# Patient Record
Sex: Female | Born: 1950 | Hispanic: No | Marital: Single
Health system: Southern US, Community
[De-identification: ages and names within clinical notes are randomized; demographics above are authoritative.]

## PROBLEM LIST (undated history)

## (undated) DIAGNOSIS — A8100 Creutzfeldt-Jakob disease, unspecified: Secondary | ICD-10-CM

## (undated) DIAGNOSIS — E119 Type 2 diabetes mellitus without complications: Secondary | ICD-10-CM

## (undated) DIAGNOSIS — F0390 Unspecified dementia without behavioral disturbance: Secondary | ICD-10-CM

## (undated) DIAGNOSIS — I10 Essential (primary) hypertension: Secondary | ICD-10-CM

---

## 2003-05-26 ENCOUNTER — Other Ambulatory Visit: Payer: Self-pay

## 2004-02-11 ENCOUNTER — Other Ambulatory Visit: Payer: Self-pay

## 2005-01-31 ENCOUNTER — Emergency Department: Payer: Self-pay | Admitting: Emergency Medicine

## 2005-02-16 ENCOUNTER — Emergency Department: Payer: Self-pay | Admitting: Emergency Medicine

## 2005-08-21 IMAGING — CR SACRUM AND COCCYX - 2+ VIEW
1 series · 4 of 4 positions shown · non-contrast
Comparison: none

REASON FOR EXAM: fall
COMMENTS:  LMP: Now

[Series 1: view not recorded · 0.17mm/px · 4 of 4 slices shown]
[im 1/4]
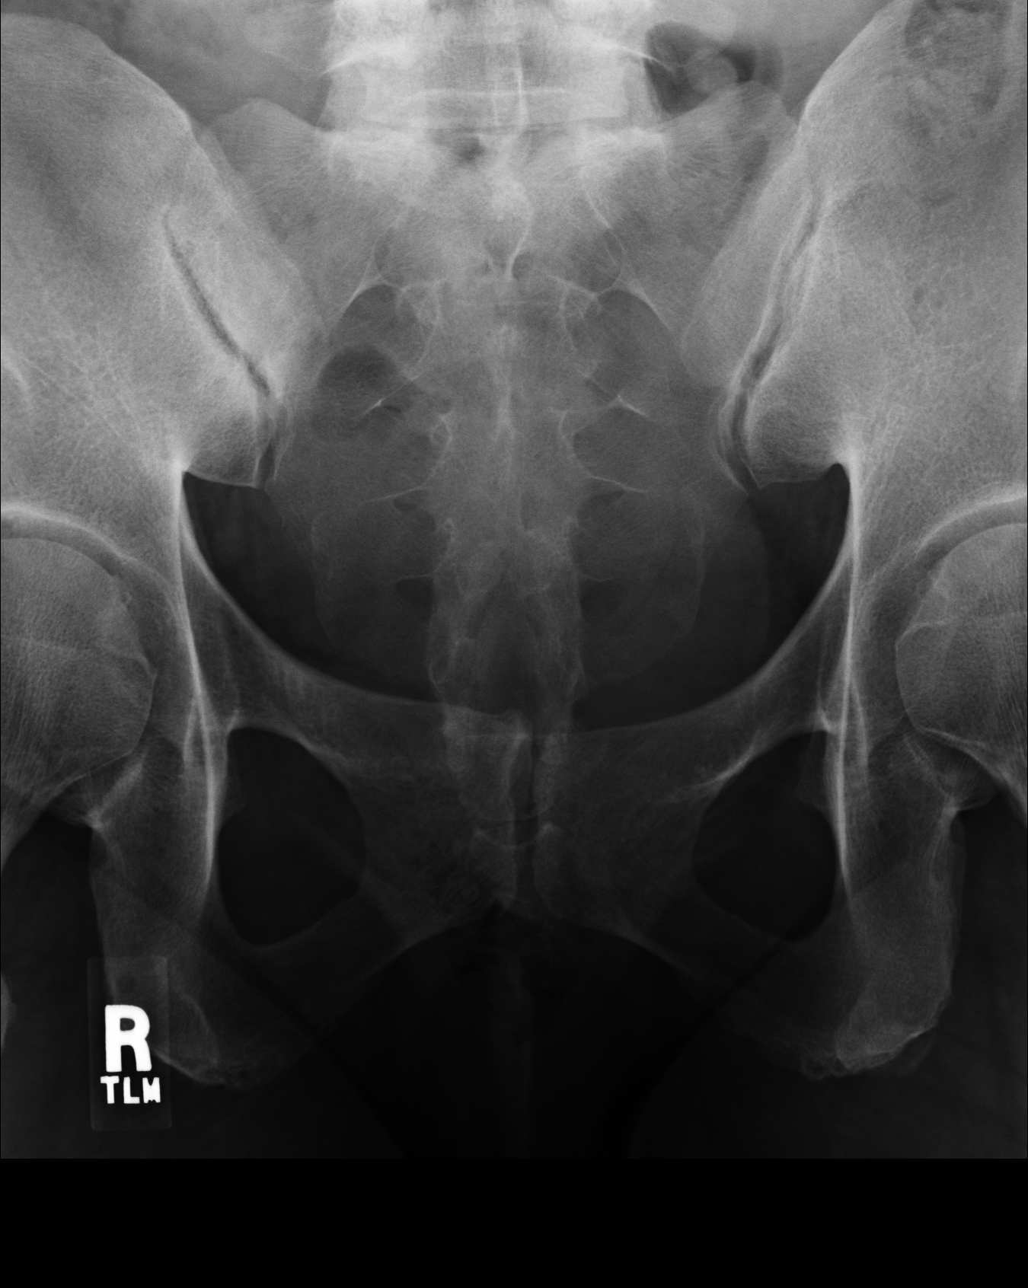
[im 2/4]
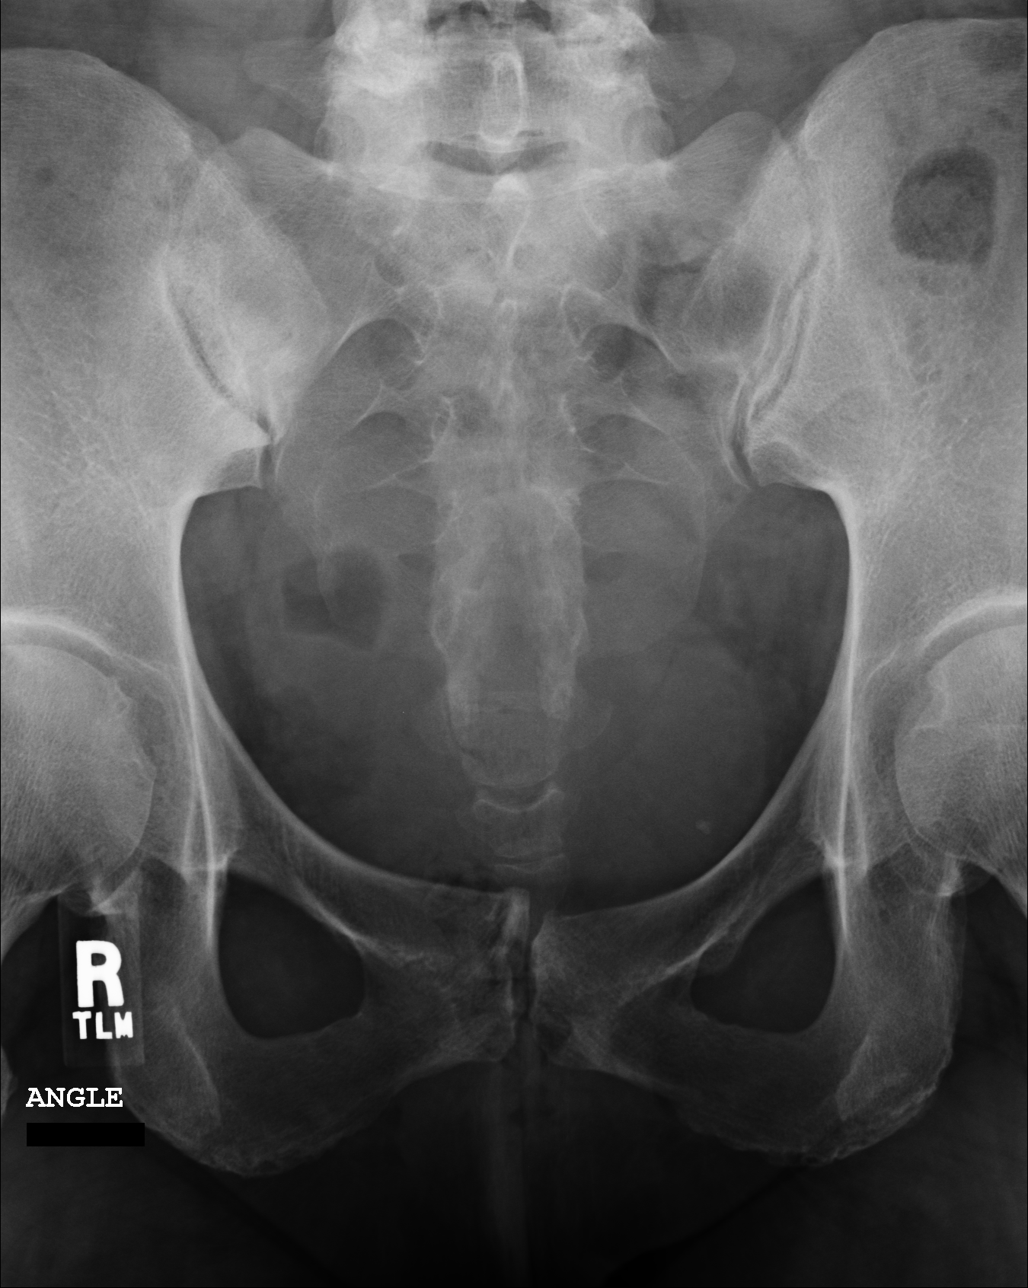
[im 3/4]
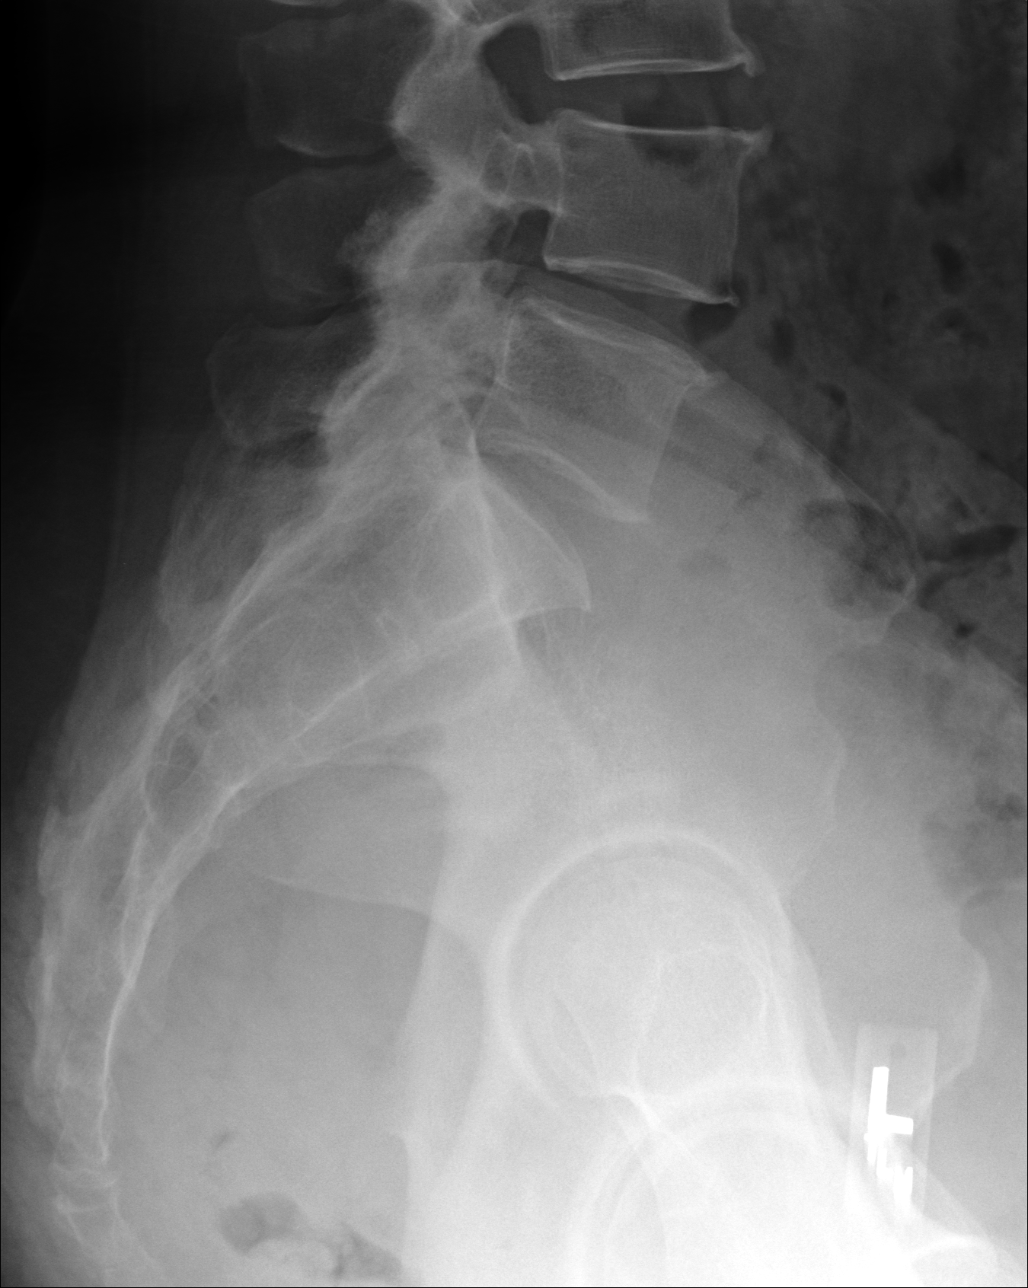
[im 4/4]
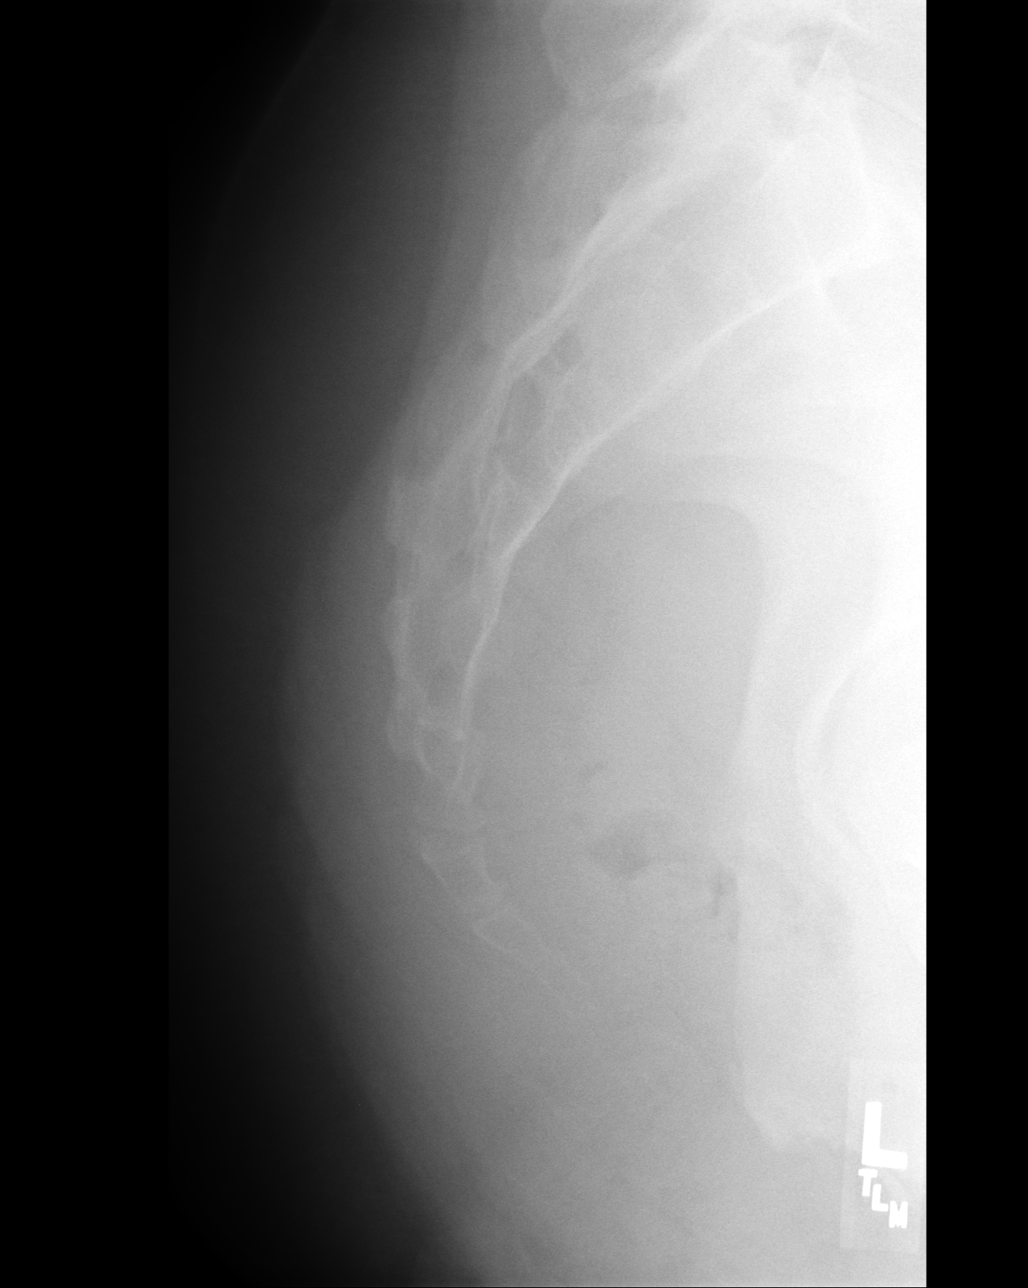

[4 of 4 positions shown; findings below may reference images not displayed]

PROCEDURE:     DXR - DXR SACRUM AND COCCYX  - February 01, 2005 [DATE]

RESULT:       Angled AP views and lateral views were obtained.   The
anterior projections show the sacral arches are intact.   There appear to be
some degenerative changes in the hips and the pubic symphysis.  The lateral
views demonstrate no definite fracture or dislocation.
IMPRESSION: No evidence of fracture or dislocation.

## 2007-09-26 ENCOUNTER — Emergency Department: Payer: Self-pay | Admitting: Emergency Medicine

## 2011-04-10 ENCOUNTER — Emergency Department: Payer: Self-pay | Admitting: Emergency Medicine

## 2012-09-22 ENCOUNTER — Emergency Department: Payer: Self-pay | Admitting: Emergency Medicine

## 2012-09-22 LAB — URINALYSIS, COMPLETE
Bacteria: NONE SEEN
Blood: NEGATIVE
Ketone: NEGATIVE
Ph: 7 (ref 4.5–8.0)
Specific Gravity: 1.011 (ref 1.003–1.030)

## 2012-09-22 LAB — CBC
HCT: 43.6 % (ref 35.0–47.0)
HGB: 14.7 g/dL (ref 12.0–16.0)
MCHC: 33.8 g/dL (ref 32.0–36.0)
MCV: 81 fL (ref 80–100)
WBC: 7 10*3/uL (ref 3.6–11.0)

## 2012-09-22 LAB — COMPREHENSIVE METABOLIC PANEL
Alkaline Phosphatase: 108 U/L (ref 50–136)
Anion Gap: 6 — ABNORMAL LOW (ref 7–16)
Bilirubin,Total: 0.3 mg/dL (ref 0.2–1.0)
Chloride: 105 mmol/L (ref 98–107)
Co2: 28 mmol/L (ref 21–32)
EGFR (African American): >60
Glucose: 137 mg/dL — ABNORMAL HIGH (ref 65–99)
Osmolality: 279 (ref 275–301)
Potassium: 3.8 mmol/L (ref 3.5–5.1)
SGOT(AST): 26 U/L (ref 15–37)
SGPT (ALT): 21 U/L (ref 12–78)

## 2016-04-22 ENCOUNTER — Emergency Department
Admission: EM | Admit: 2016-04-22 | Discharge: 2016-04-23 | Disposition: A | Discharge status: Home / Self Care | Attending: Emergency Medicine | Admitting: Emergency Medicine

## 2016-04-22 ENCOUNTER — Emergency Department

## 2016-04-22 DIAGNOSIS — R531 Weakness: Secondary | ICD-10-CM

## 2016-04-22 DIAGNOSIS — E119 Type 2 diabetes mellitus without complications: Secondary | ICD-10-CM | POA: Insufficient documentation

## 2016-04-22 DIAGNOSIS — L89152 Pressure ulcer of sacral region, stage 2: Secondary | ICD-10-CM | POA: Insufficient documentation

## 2016-04-22 DIAGNOSIS — I10 Essential (primary) hypertension: Secondary | ICD-10-CM | POA: Insufficient documentation

## 2016-04-22 DIAGNOSIS — R262 Difficulty in walking, not elsewhere classified: Secondary | ICD-10-CM

## 2016-04-22 DIAGNOSIS — Z79899 Other long term (current) drug therapy: Secondary | ICD-10-CM | POA: Diagnosis not present

## 2016-04-22 DIAGNOSIS — M25552 Pain in left hip: Secondary | ICD-10-CM | POA: Diagnosis not present

## 2016-04-22 DIAGNOSIS — M25559 Pain in unspecified hip: Secondary | ICD-10-CM

## 2016-04-22 HISTORY — DX: Creutzfeldt-Jakob disease, unspecified: A81.00

## 2016-04-22 HISTORY — DX: Type 2 diabetes mellitus without complications: E11.9

## 2016-04-22 HISTORY — DX: Essential (primary) hypertension: I10

## 2016-04-22 HISTORY — DX: Unspecified dementia, unspecified severity, without behavioral disturbance, psychotic disturbance, mood disturbance, and anxiety: F03.90

## 2016-04-22 LAB — CBC WITH DIFFERENTIAL/PLATELET
BASOS ABS: 0.1 10*3/uL (ref 0–0.1)
BASOS PCT: 1 %
EOS ABS: 0 10*3/uL (ref 0–0.7)
Eosinophils Relative: 0 %
HEMATOCRIT: 40.5 % (ref 35.0–47.0)
HEMOGLOBIN: 13.2 g/dL (ref 12.0–16.0)
Lymphocytes Relative: 13 %
Lymphs Abs: 1.2 10*3/uL (ref 1.0–3.6)
MCH: 26.7 pg (ref 26.0–34.0)
MCHC: 32.6 g/dL (ref 32.0–36.0)
MCV: 81.7 fL (ref 80.0–100.0)
MONO ABS: 0.9 10*3/uL (ref 0.2–0.9)
Monocytes Relative: 9 %
NEUTROS ABS: 7.6 10*3/uL — AB (ref 1.4–6.5)
NEUTROS PCT: 77 %
Platelets: 305 10*3/uL (ref 150–440)
RBC: 4.96 MIL/uL (ref 3.80–5.20)
RDW: 13.8 % (ref 11.5–14.5)
WBC: 9.8 10*3/uL (ref 3.6–11.0)

## 2016-04-22 LAB — COMPREHENSIVE METABOLIC PANEL
ALBUMIN: 2.7 g/dL — AB (ref 3.5–5.0)
ALT: 40 U/L (ref 14–54)
AST: 63 U/L — AB (ref 15–41)
Alkaline Phosphatase: 76 U/L (ref 38–126)
Anion gap: 6 (ref 5–15)
BILIRUBIN TOTAL: 0.7 mg/dL (ref 0.3–1.2)
BUN: 11 mg/dL (ref 6–20)
CO2: 25 mmol/L (ref 22–32)
Calcium: 9.4 mg/dL (ref 8.9–10.3)
Chloride: 106 mmol/L (ref 101–111)
Creatinine, Ser: 0.76 mg/dL (ref 0.44–1.00)
GFR calc Af Amer: >60 mL/min (ref >60)
GFR calc non Af Amer: >60 mL/min (ref >60)
GLUCOSE: 260 mg/dL — AB (ref 65–99)
POTASSIUM: 4 mmol/L (ref 3.5–5.1)
SODIUM: 137 mmol/L (ref 135–145)
TOTAL PROTEIN: 6.9 g/dL (ref 6.5–8.1)

## 2016-04-22 LAB — TROPONIN I: Troponin I: <0.03 ng/mL (ref <0.03)

## 2016-04-22 LAB — URINALYSIS COMPLETE WITH MICROSCOPIC (ARMC ONLY)
Bilirubin Urine: NEGATIVE
Glucose, UA: NEGATIVE mg/dL
KETONES UR: NEGATIVE mg/dL
NITRITE: NEGATIVE
PH: 6 (ref 5.0–8.0)
PROTEIN: 30 mg/dL — AB
SPECIFIC GRAVITY, URINE: 1.014 (ref 1.005–1.030)

## 2016-04-22 LAB — GLUCOSE, CAPILLARY
GLUCOSE-CAPILLARY: 204 mg/dL — AB (ref 65–99)
Glucose-Capillary: 154 mg/dL — ABNORMAL HIGH (ref 65–99)
Glucose-Capillary: 162 mg/dL — ABNORMAL HIGH (ref 65–99)

## 2016-04-22 MED ORDER — LATANOPROST 0.005 % OP SOLN
1.0000 [drp] | Freq: Every day | OPHTHALMIC | Status: DC
  Filled 2016-04-22: qty 2.5

## 2016-04-22 MED ORDER — LORAZEPAM 2 MG/ML IJ SOLN
0.5000 mg | Freq: Once | INTRAMUSCULAR | Status: AC
  Administered 2016-04-22: 0.5 mg via INTRAVENOUS
  Filled 2016-04-22: qty 1

## 2016-04-22 MED ORDER — POLYETHYLENE GLYCOL 3350 17 G PO PACK
17.0000 g | PACK | Freq: Every day | ORAL | Status: DC
  Administered 2016-04-23: 17 g via ORAL
  Filled 2016-04-22: qty 1

## 2016-04-22 MED ORDER — QUETIAPINE FUMARATE 25 MG PO TABS
100.0000 mg | ORAL_TABLET | Freq: Four times a day (QID) | ORAL | Status: DC
  Administered 2016-04-22 – 2016-04-23 (×4): 100 mg via ORAL
  Filled 2016-04-22 (×4): qty 4

## 2016-04-22 MED ORDER — LEVOTHYROXINE SODIUM 50 MCG PO TABS
50.0000 ug | ORAL_TABLET | Freq: Every day | ORAL | Status: DC
  Administered 2016-04-23: 50 ug via ORAL
  Filled 2016-04-22 (×2): qty 1

## 2016-04-22 MED ORDER — INSULIN DETEMIR 100 UNIT/ML ~~LOC~~ SOLN
40.0000 [IU] | Freq: Every day | SUBCUTANEOUS | Status: DC
  Filled 2016-04-22: qty 0.4

## 2016-04-22 MED ORDER — TRAZODONE HCL 50 MG PO TABS
50.0000 mg | ORAL_TABLET | Freq: Every day | ORAL | Status: DC
  Administered 2016-04-22: 50 mg via ORAL
  Filled 2016-04-22 (×2): qty 1

## 2016-04-22 MED ORDER — INSULIN ASPART 100 UNIT/ML ~~LOC~~ SOLN
SUBCUTANEOUS | Status: AC
  Administered 2016-04-22: 7 [IU] via SUBCUTANEOUS
  Filled 2016-04-22: qty 7

## 2016-04-22 MED ORDER — PANTOPRAZOLE SODIUM 40 MG PO TBEC
40.0000 mg | DELAYED_RELEASE_TABLET | Freq: Every day | ORAL | Status: DC
  Administered 2016-04-23: 40 mg via ORAL
  Filled 2016-04-22 (×2): qty 1

## 2016-04-22 MED ORDER — LORAZEPAM 1 MG PO TABS: 1.0000 mg | ORAL_TABLET | ORAL | Status: DC | PRN

## 2016-04-22 MED ORDER — QUETIAPINE FUMARATE 25 MG PO TABS
25.0000 mg | ORAL_TABLET | Freq: Every day | ORAL | Status: DC
  Administered 2016-04-22: 25 mg via ORAL
  Filled 2016-04-22: qty 1

## 2016-04-22 MED ORDER — HALOPERIDOL 0.5 MG PO TABS
0.5000 mg | ORAL_TABLET | ORAL | Status: DC | PRN
  Filled 2016-04-22: qty 2

## 2016-04-22 MED ORDER — ATORVASTATIN CALCIUM 20 MG PO TABS
80.0000 mg | ORAL_TABLET | Freq: Every day | ORAL | Status: DC
  Administered 2016-04-23: 80 mg via ORAL
  Filled 2016-04-22 (×2): qty 4

## 2016-04-22 MED ORDER — POLYETHYLENE GLYCOL 3350 17 GM/SCOOP PO POWD: 17.0000 g | Freq: Every day | ORAL | Status: DC

## 2016-04-22 MED ORDER — LORAZEPAM 0.5 MG PO TABS
0.5000 mg | ORAL_TABLET | Freq: Once | ORAL | Status: DC
  Filled 2016-04-22: qty 1

## 2016-04-22 MED ORDER — INSULIN ASPART 100 UNIT/ML ~~LOC~~ SOLN
7.0000 [IU] | Freq: Three times a day (TID) | SUBCUTANEOUS | Status: DC
  Administered 2016-04-22: 3 [IU] via SUBCUTANEOUS
  Administered 2016-04-22 – 2016-04-23 (×2): 7 [IU] via SUBCUTANEOUS
  Filled 2016-04-22 (×2): qty 7

## 2016-04-22 MED ORDER — ENALAPRIL MALEATE 10 MG PO TABS
20.0000 mg | ORAL_TABLET | Freq: Two times a day (BID) | ORAL | Status: DC
  Administered 2016-04-22 (×2): 20 mg via ORAL
  Filled 2016-04-22 (×3): qty 2
  Filled 2016-04-22: qty 1
  Filled 2016-04-22 (×2): qty 2

## 2016-04-22 MED ORDER — TIMOLOL MALEATE 0.5 % OP SOLN
1.0000 [drp] | Freq: Every day | OPHTHALMIC | Status: DC
  Administered 2016-04-22: 1 [drp] via OPHTHALMIC
  Filled 2016-04-22: qty 5

## 2016-04-22 MED ORDER — INSULIN DETEMIR 100 UNIT/ML ~~LOC~~ SOLN
70.0000 [IU] | Freq: Every day | SUBCUTANEOUS | Status: DC
  Filled 2016-04-22: qty 0.7

## 2016-04-22 MED ORDER — SENNOSIDES-DOCUSATE SODIUM 8.6-50 MG PO TABS
2.0000 | ORAL_TABLET | Freq: Every day | ORAL | Status: DC
  Administered 2016-04-22: 2 via ORAL
  Filled 2016-04-22: qty 2
  Filled 2016-04-22: qty 1

## 2016-04-22 NOTE — Progress Notes (Signed)
Inpatient Diabetes Program Recommendations  AACE/ADA: New Consensus Statement on Inpatient Glycemic Control (2015)  Target Ranges:  Prepandial:   less than 140 mg/dL      Peak postprandial:   less than 180 mg/dL (1-2 hours)      Critically ill patients:  140 - 180 mg/dL   Lab Results  Component Value Date   GLUCAP 204 (H) 04/22/2016    Review of Glycemic Control:  Results for Angela Saunders, Angela Saunders (MRN [id number]) as of 04/22/2016 11:11  Ref. Range 04/22/2016 09:13  Glucose-Capillary Latest Ref Range: 65 - 99 mg/dL 956204 (H)   Diabetes history: Type 2 diabetes Outpatient Diabetes medications: Levemir 70 units q HS, Novolog 7 units tid with meals Current orders for Inpatient glycemic control:  Levemir 70 units q HS, Novolog 7 units tid with meals  Inpatient Diabetes Program Recommendations:   Please consider adding Novolog moderate tid with meals and HS.    Thanks, Beryl MeagerJenny Lizandro Spellman, RN, BC-ADM Inpatient Diabetes Coordinator Pager [phone] (8a-5p)

## 2016-04-22 NOTE — Evaluation (Signed)
Physical Therapy Evaluation Patient Details Name: Angela Saunders MRN: [id number] DOB: [birth year] Today's Date: 04/22/2016   History of Present Illness  Pt is a 65 y/o F who presented to the ED with chief complaint of L hip pain and generalized weakness after a fall out of her WC at home.  CT L hip negative for acute fracture.  Pt's PMH includes Creutzfeldt-Jakob disease, dementia.       Clinical Impression  Pt admitted with above diagnosis. Pt currently with functional limitations due to the deficits listed below (see PT Problem List). Pt easily agitated and pt appears to have a very strong pain response even to light touch.  Pt screaming "Mom!" and thrashing out at PT and RN with any physical touch and does not stop screaming until both have left pt's room.  Spoke with pt's daughter on the phone who reported pt's baseline as bed>WC transfer with assist and pt able to propel WC using LEs.  Per daughter pt is constantly agitated and this is not a new behavior.  Explained this PT's recommendation for SNF at d/c and passed the phone to CSW, Angela FifeLynn, to discuss d/c planning.  Daughter, Angela SeminoleRosalyn, very appreciative.  Pt will benefit from skilled PT to increase their independence and safety with mobility to allow discharge to the venue listed below.      Follow Up Recommendations SNF    Equipment Recommendations  Other (comment) (TBD at next venue of care)    Recommendations for Other Services       Precautions / Restrictions Precautions Precautions: Fall;Other (comment) Precaution Comments: very agitated and aggressive  Restrictions Weight Bearing Restrictions: No      Mobility  Bed Mobility Overal bed mobility: Needs Assistance;+2 for physical assistance Bed Mobility: Rolling Rolling: Total assist;+2 for physical assistance         General bed mobility comments: Total +2 assist to perform rolling for positioning rotation as pt becomes aggressive reaching for and grabbing RN and this  PT.  Pillow placed under L hip for pt comfort.  Transfers                 General transfer comment: unable to assess  Ambulation/Gait                Stairs            Wheelchair Mobility    Modified Rankin (Stroke Patients Only)       Balance                                             Pertinent Vitals/Pain Pain Assessment: Faces Faces Pain Scale: Hurts whole lot Pain Location: generalized (becomes aggressive with any physical touch) Pain Descriptors / Indicators: Grimacing;Guarding (yelling) Pain Intervention(s): Limited activity within patient's tolerance;Monitored during session;Repositioned    Home Living Family/patient expects to be discharged to:: Skilled nursing facility                      Prior Function Level of Independence: Needs assistance   Gait / Transfers Assistance Needed: Per daughter pt needed assist [facility] and then pt could manage her WC around the home using her LEs to propel  ADL's / Homemaking Assistance Needed: Needs assist for all ADLs  Comments: Pt is from home with daughter as 24/7 caregiver.  She  was receiving HH hopice services PTA but daughter feels as though pt is not receiving adequate care with this.     Hand Dominance        Extremity/Trunk Assessment               Lower Extremity Assessment: RLE deficits/detail;LLE deficits/detail RLE Deficits / Details: No tone noted with PROM heel slide and DF to neutral. Otherwise unable to perform formal assessment due to pt's agitation and aggressive behavior LLE Deficits / Details: No tone noted with PROM heel slide and DF to neutral. Otherwise unable to perform formal assessment due to pt's agitation and aggressive behavior     Communication   Communication: No difficulties  Cognition Arousal/Alertness: Awake/alert Behavior During Therapy: Agitated;Anxious Overall Cognitive Status: History of cognitive  impairments - at baseline                      General Comments General comments (skin integrity, edema, etc.): Pt easily agitated and pt appears to have a very strong pain response even to light touch.  Pt screaming "Mom!" and thrashing out at PT and RN with any physical touch and does not stop screaming until both have left pt's room.  Spoke with pt's daughter on the phone who reported pt's baseline as bed>WC transfer with assist and pt able to propel WC using LEs.  Per daughter pt is constantly agitated and this is not a new behavior.  Explained this PT's recommendation for SNF at d/c and passed the phone to CSW, Angela Saunders, to discuss d/c planning.  Daughter, Angela Saunders, very appreciative.     Exercises     Assessment/Plan    PT Assessment Patient needs continued PT services  PT Problem List Decreased strength;Decreased activity tolerance;Decreased balance;Decreased mobility;Decreased cognition;Decreased knowledge of use of DME;Decreased safety awareness;Pain          PT Treatment Interventions DME instruction;Functional mobility training;Therapeutic activities;Therapeutic exercise;Balance training;Neuromuscular re-education;Cognitive remediation;Patient/family education;Wheelchair mobility training    PT Goals (Current goals can be found in the Care Plan section)  Acute Rehab PT Goals Patient Stated Goal: Per daughter, SNF at d/c PT Goal Formulation: With family Time For Goal Achievement: 04/29/16 Potential to Achieve Goals: Fair    Frequency Min 2X/week   Barriers to discharge   Pt currently unable to tolerate any level of mobility    Co-evaluation               End of Session   Activity Tolerance: Treatment limited secondary to agitation Patient left: in bed Nurse Communication: Mobility status    Functional Assessment Tool Used: Clinical Judgement Functional Limitation: Mobility: Walking and moving around Mobility: Walking and Moving Around Current Status  (X9147): At least 80 percent but less than 100 percent impaired, limited or restricted Mobility: Walking and Moving Around Goal Status [phone]): At least 80 percent but less than 100 percent impaired, limited or restricted    Time: 2130-8657 PT Time Calculation (min) (ACUTE ONLY): 12 min   Charges:   PT Evaluation $PT Eval Moderate Complexity: 1 Procedure     PT G Codes:   PT G-Codes **NOT FOR INPATIENT CLASS** Functional Assessment Tool Used: Clinical Judgement Functional Limitation: Mobility: Walking and moving around Mobility: Walking and Moving Around Current Status (Q4696): At least 80 percent but less than 100 percent impaired, limited or restricted Mobility: Walking and Moving Around Goal Status [phone]): At least 80 percent but less than 100 percent impaired, limited or restricted   Encarnacion Chu PT,  DPT 04/22/2016, 2:18 PM

## 2016-04-22 NOTE — Progress Notes (Signed)
ED visit made. Patient is currently followed by [facility] at home with a  Diagnosis of Creutzfeldt-Jakob disease. She is a Full Code. Patient was sent to the ED overnight by her daughter Geri SeminoleRosalyn for evaluation of left leg pain and sacral wound. CT and performed on her left hip was negative for any acute fracture. Per chart note review and discussion with patient's hospice RN case manager her daughter is open to possible placement. Physical therapy evaluation has been ordered. Writer spoke with ED CSW Donnelly StagerLynn Bryant who is awaiting PT outcome before discussing [facility] as PT evaluation will determine type of placement. Patient seen lying on the ED stretcher, alert, appeared drowsy and did state "I'm sleepy". No family present at time of visit, her daughter had gone home to get some rest.  Per staff RN Katie patient does call out with any physical interaction such as turning, she was able to drink both apple juice and milk as well as eat peanutbutter crackers when fed. She does have her home medications ordered. Hospital care team made aware that she is a current [facility] patient. Writer's contact information left with staff RN Dillard'sShannin. Will continue to follow and update hospice team. Thank you. Dayna BarkerKaren Robertson RN, BSN, [facility],  [facility] Liaison [phone] c

## 2016-04-22 NOTE — Progress Notes (Signed)
CSW received consult for possible placement. Pt has not yet been seen by PT. CSW will be available to assist in potential placement for pt once PT assesses the appropriate level of care. CSW will continue to follow pt and assist as needed.  Jonathon JordanLynn B Rubye Strohmeyer, MSW, Theresia MajorsLCSWA [phone]

## 2016-04-22 NOTE — ED Notes (Signed)
Daughter Angela Saunders called to give this nurse update on her discussion with the MD. Daughter said she was " ok" with the urinalysis (in and out cath) if this was necessary for placement. Previously this shift, daughter said she did not want the in and out catheter, therefore we were unable to collect urinalysis earlier.

## 2016-04-22 NOTE — ED Provider Notes (Signed)
[facility] Emergency Department Provider Note   ____________________________________________   First MD Initiated Contact with Patient 04/22/16 0118     (approximate)  I have reviewed the triage vital signs and the nursing notes.   HISTORY  Chief Complaint Leg Pain  History limited by dementia; history obtained by daughter  HPI Angela Saunders is a 65 y.o. female brought to the ED from home via EMS with a chief complaint of left hip pain, decreased oral appetite and generalized weakness. 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 Daughter reports patient pivoted and fell one week ago and complains of persistent left hip pain. Daughter is more concerned that patient has had decreased PO intake, generalized weakness, and sacral decubitus that she feels is being inadequately cared for by hospice nurses. Denies recent fever, chest pain, shortness of breath, abdominal pain, vomiting, diarrhea.Nothing makes patient's symptoms better or worse.   Past Medical History:  Diagnosis Date  . Creutzfeldt-Jakob disease   . Dementia   . Diabetes mellitus without complication (HCC)   . Hypertension     There are no active problems to display for this patient.   No past surgical history on file.  Prior to Admission medications   Medication Sig Start Date End Date Taking? Authorizing Provider  atorvastatin (LIPITOR) 80 MG tablet Take 1 tablet by mouth daily. 01/29/16  Yes Historical Provider, MD  [employer] SENNA PLUS 8.6-50 MG tablet Take 2 tablets by mouth at bedtime. 04/10/16  Yes Historical Provider, MD  enalapril (VASOTEC) 20 MG tablet Take 1 tablet by mouth 2 (two) times daily. 01/19/16  Yes Historical Provider, MD  haloperidol (HALDOL) 0.5 MG tablet Take 1-2 tablets by mouth every 4 (four) hours as needed. 04/07/16  Yes Historical Provider, MD  latanoprost (XALATAN) 0.005 % ophthalmic  solution Place 1 drop into both eyes at bedtime. 01/29/16  Yes Historical Provider, MD  LEVEMIR FLEXTOUCH 100 UNIT/ML Pen Inject 70 Units into the skin at bedtime. 03/11/16  Yes Historical Provider, MD  levothyroxine (SYNTHROID, LEVOTHROID) 50 MCG tablet Take 1 tablet by mouth daily. 02/03/16  Yes Historical Provider, MD  LORazepam (ATIVAN) 1 MG tablet Take 1 tablet by mouth every 2 (two) hours as needed. 04/07/16  Yes Historical Provider, MD  NOVOLOG FLEXPEN 100 UNIT/ML FlexPen Inject 7 Units into the skin 3 (three) times daily. 03/08/16  Yes Historical Provider, MD  pantoprazole (PROTONIX) 40 MG tablet Take 40 mg by mouth daily. 02/03/16  Yes Historical Provider, MD  polyethylene glycol powder (GLYCOLAX/MIRALAX) powder Take 17 g by mouth daily. 01/29/16  Yes Historical Provider, MD  QUEtiapine (SEROQUEL) 100 MG tablet Take 100 mg by mouth every 6 (six) hours. 04/07/16  Yes Historical Provider, MD  QUEtiapine (SEROQUEL) 25 MG tablet Take 25 mg by mouth at bedtime. 04/09/16  Yes Historical Provider, MD  timolol (TIMOPTIC) 0.5 % ophthalmic solution Place 1 drop into both eyes daily. 01/29/16  Yes Historical Provider, MD  traZODone (DESYREL) 50 MG tablet Take 1 tablet by mouth at bedtime. 02/15/16  Yes Historical Provider, MD    Allergies Metformin and Oxycodone  No family history on file.  Social History Social History  Substance Use Topics  . Smoking status: Not on file  . Smokeless tobacco: Not on file  . Alcohol use Not on file    Review of Systems  Constitutional: Positive for generalized weakness and decreased oral intake. No fever/chills.  Eyes: No visual changes. ENT: No sore throat. Cardiovascular: Denies chest pain. Respiratory: Denies shortness of breath. Gastrointestinal: No abdominal pain.  No nausea, no vomiting.  No diarrhea.  No constipation. Genitourinary: Negative for dysuria. Musculoskeletal: Positive for left hip pain. Negative for back pain. Skin: Negative for  rash. Neurological: Negative for headaches, focal weakness or numbness.  10-point ROS otherwise negative.  ____________________________________________   PHYSICAL EXAM:  VITAL SIGNS: ED Triage Vitals  Enc Vitals Group     BP 04/22/16 0121 116/73     Pulse Rate 04/22/16 0118 (!) 102     Resp 04/22/16 0118 16     Temp 04/22/16 0118 98.3 F (36.8 C)     Temp Source 04/22/16 0118 Oral     SpO2 04/22/16 0118 98 %     Weight 04/22/16 0118 250 lb (113.4 kg)     Height --      Head Circumference --      Peak Flow --      Pain Score --      Pain Loc --      Pain Edu? --      Excl. in GC? --     Constitutional: Alert and oriented. Chronically ill appearing and in mild acute distress. Cries on examination of left hip and transferring from EMS stretcher to ED bed. Eyes: Conjunctivae are normal. PERRL. EOMI. Head: Atraumatic. Nose: No congestion/rhinnorhea. Mouth/Throat: Mucous membranes are moist.  Oropharynx non-erythematous. Neck: No stridor.  No cervical spine tenderness to palpation.  No step-offs or deformities. Cardiovascular: Normal rate, regular rhythm. Grossly normal heart sounds.  Good peripheral circulation. Respiratory: Normal respiratory effort.  No retractions. Lungs CTAB. Gastrointestinal: Soft and nontender. No distention. No abdominal bruits. No CVA tenderness. Stage II sacral decubitus. Musculoskeletal: Left lower leg presents. Tender to palpation left hip. No obvious deformities noted. Exam limited by patient uncooperative. Neurologic:  Nonverbal at baseline.  Skin:  Skin is warm, dry and intact. No rash noted. Psychiatric: Mood and affect are normal. Speech and behavior are normal.  ____________________________________________   LABS (all labs ordered are listed, but only abnormal results are displayed)  Labs Reviewed  CBC WITH DIFFERENTIAL/PLATELET - Abnormal; Notable for the following:       Result Value   Neutro Abs 7.6 (*)    All other components  within normal limits  COMPREHENSIVE METABOLIC PANEL - Abnormal; Notable for the following:    Glucose, Bld 260 (*)    Albumin 2.7 (*)    AST 63 (*)    All other components within normal limits  TROPONIN I  URINALYSIS COMPLETEWITH MICROSCOPIC ([facility] ONLY)   ____________________________________________  EKG  ED ECG REPORT I, Korina Tretter J, the attending physician, personally viewed and interpreted this ECG.   Date: 04/22/2016  EKG Time: 0137  Rate: 99  Rhythm: normal EKG, normal sinus rhythm  Axis: Normal  Intervals:none  ST&T Change: Nonspecific  ____________________________________________  RADIOLOGY  CT left hip without contrast interpreted per Dr. Clovis Riley: Negative for acute fracture. Mild soft tissue edema or hemorrhage  without drainable collection, deep to the medial aspect of the left  gluteus maximus.   ____________________________________________   PROCEDURES  Procedure(s) performed: None  Procedures  Critical Care performed: No  ____________________________________________   INITIAL IMPRESSION / ASSESSMENT AND PLAN / ED COURSE  Pertinent labs & imaging results that were available during my care of the patient were reviewed by me and considered in my medical decision making (see chart for details).  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 Also with generalized weakness and decreased oral intake. Will obtain lab work, urinalysis, x-ray imaging studies and reassess.  Clinical Course  Value Comment By Time  DG Hip Unilat With Pelvis 2-3 Views Left (Reviewed) Irean HongJade J Londa Mackowski, MD 10/30 0219   Extensive discussion with daughter. Updated her of x-ray imaging results. Hospice nurse called; there is a respite bed available for patients. However, daughter does not want patient to go due to prior bad experience. She is interested in placement. I explained to her  that patient would not be an operative candidate given that she is normally bedbound. However, will obtain CT to evaluate for fracture. Daughter does not want an in out catheter; will attempt to collect urine. At this time, laboratory results do not warrant an inpatient hospitalization. Will consult clinical social work for placement. Irean HongJade J Rashika Bettes, MD 10/30 [phone]   Updated daughter of negative CT results. Will proceed with plan to for patient in the emergency department pending clinical social work evaluation for placement. Irean HongJade J Yasha Tibbett, MD 10/30 0510   Patient sleeping in no acute distress. Once she is awake, we will switch her to a more comfortable, inpatient bed as she awaits social work consult. Diet order will be placed by nursing. Irean HongJade J Niyah Mamaril, MD 10/30 [phone]     ____________________________________________   FINAL CLINICAL IMPRESSION(S) / ED DIAGNOSES  Final diagnoses:  Weakness  Pain of left hip joint  Sacral decubitus ulcer, stage II      NEW MEDICATIONS STARTED DURING THIS VISIT:  New Prescriptions   No medications on file     Note:  This document was prepared using Dragon voice recognition software and may include unintentional dictation errors.    Irean HongJade J Tiaira Arambula, MD 04/22/16 [phone]

## 2016-04-22 NOTE — ED Triage Notes (Signed)
Per EMS, pt's daughter was transferring pt 1 week ago to wheelchair and pt fell.  Pt on home hospice, and home hospice stated they didn't feel like it was broken.  Daughter reported to EMS that pain has gotten worse over past week.  Pt has dementia at baseline and not able to provide information to this RN at this time.  Per EMS, daughter on way.

## 2016-04-22 NOTE — ED Notes (Signed)
Pt repositioned in bed, new VS taken. CBG checked, pt given apple juice. MD notified of new diet order and daily medications ordered. Pt resting comfortably on stretcher, will continue to monitor.

## 2016-04-22 NOTE — [place] FL2 (Signed)
[place] MEDICAID FL2 LEVEL OF CARE SCREENING TOOL     IDENTIFICATION  Patient Name: Angela Saunders Birthdate: Jul 10, 1950 Sex: female Admission Date (Current Location): 04/22/2016  [place]:  [place] [id number] R Facility and Address:  [facility], [address], [place], [place] [address]      Provider Number: [id number]  Attending Physician Name and Address:  Daryel NovemberJonathan Williams, MD  Relative Name and Phone Number:       Current Level of Care: Hospital Recommended Level of Care: Skilled Nursing Facility Prior Approval Number:    Date Approved/Denied: 04/22/16 PASRR Number: [id number]  Discharge Plan: SNF    Current Diagnoses: 1. Weakness   2. Pain of left hip joint   3. Sacral decubitus ulcer, stage II   4. Difficulty in walking     Active Ambulatory Problems    Diagnosis Date Noted  . No Active Ambulatory Problems   Resolved Ambulatory Problems    Diagnosis Date Noted  . No Resolved Ambulatory Problems   Past Medical History:  Diagnosis Date  . Creutzfeldt-Jakob disease   . Dementia   . Diabetes mellitus without complication (HCC)   . Hypertension      Orientation RESPIRATION BLADDER Height & Weight     Self  Normal Incontinent Weight: 250 lb (113.4 kg) Height:     BEHAVIORAL SYMPTOMS/MOOD NEUROLOGICAL BOWEL NUTRITION STATUS      Incontinent Diet (Diet Carb Modified Fluid consistency: Thin)  AMBULATORY STATUS COMMUNICATION OF NEEDS Skin   Extensive Assist Verbally Normal                       Personal Care Assistance Level of Assistance  Bathing, Feeding, Dressing Bathing Assistance: Limited assistance Feeding assistance: Independent Dressing Assistance: Limited assistance     Functional Limitations Info  Sight, Hearing, Speech Sight Info: Adequate Hearing Info: Adequate Speech Info: Adequate    SPECIAL CARE FACTORS FREQUENCY  PT (By licensed PT), OT (By licensed OT)     PT  Frequency: 5x OT Frequency: 5x            Contractures Contractures Info: Not present    Additional Factors Info  Insulin Sliding Scale, Code Status, Allergies Code Status Info: Full Code Allergies Info:  Metformin, Oxycodone   Insulin Sliding Scale Info: insulin aspart (novoLOG) injection 7 Units       Current Medications (04/22/2016):  This is the current hospital active medication list Current Facility-Administered Medications  Medication Dose Route Frequency Provider Last Rate Last Dose  . atorvastatin (LIPITOR) tablet 80 mg  80 mg Oral Daily Emily FilbertJonathan E Williams, MD      . enalapril (VASOTEC) tablet 20 mg  20 mg Oral BID Emily FilbertJonathan E Williams, MD   20 mg at 04/22/16 1132  . haloperidol (HALDOL) tablet 0.5-1 mg  0.5-1 mg Oral Q4H PRN Emily FilbertJonathan E Williams, MD      . insulin aspart (novoLOG) injection 7 Units  7 Units Subcutaneous TID Emily FilbertJonathan E Williams, MD   7 Units at 04/22/16 1048  . insulin detemir (LEVEMIR) injection 70 Units  70 Units Subcutaneous QHS Emily FilbertJonathan E Williams, MD      . latanoprost (XALATAN) 0.005 % ophthalmic solution 1 drop  1 drop Both Eyes QHS Emily FilbertJonathan E Williams, MD      . levothyroxine (SYNTHROID, LEVOTHROID) tablet 50 mcg  50 mcg Oral Daily Emily FilbertJonathan E Williams, MD      . LORazepam (ATIVAN) tablet 1 mg  1 mg  Oral Q2H PRN Emily FilbertJonathan E Williams, MD      . pantoprazole (PROTONIX) EC tablet 40 mg  40 mg Oral Daily Emily FilbertJonathan E Williams, MD      . polyethylene glycol (MIRALAX / GLYCOLAX) packet 17 g  17 g Oral Daily Emily FilbertJonathan E Williams, MD      . QUEtiapine (SEROQUEL) tablet 100 mg  100 mg Oral Q6H Emily FilbertJonathan E Williams, MD   100 mg at 04/22/16 1139  . QUEtiapine (SEROQUEL) tablet 25 mg  25 mg Oral QHS Emily FilbertJonathan E Williams, MD      . senna-docusate (Senokot-S) tablet 2 tablet  2 tablet Oral QHS Emily FilbertJonathan E Williams, MD      . timolol (TIMOPTIC) 0.5 % ophthalmic solution 1 drop  1 drop Both Eyes Daily Emily FilbertJonathan E Williams, MD   1 drop at 04/22/16 1132  . traZODone  (DESYREL) tablet 50 mg  50 mg Oral QHS Emily FilbertJonathan E Williams, MD       Current Outpatient Prescriptions  Medication Sig Dispense Refill  . atorvastatin (LIPITOR) 80 MG tablet Take 1 tablet by mouth daily.    . [facility] SENNA PLUS 8.6-50 MG tablet Take 2 tablets by mouth at bedtime.  12  . enalapril (VASOTEC) 20 MG tablet Take 1 tablet by mouth 2 (two) times daily.    . haloperidol (HALDOL) 0.5 MG tablet Take 1-2 tablets by mouth every 4 (four) hours as needed.  1  . latanoprost (XALATAN) 0.005 % ophthalmic solution Place 1 drop into both eyes at bedtime.    Marland Kitchen. LEVEMIR FLEXTOUCH 100 UNIT/ML Pen Inject 70 Units into the skin at bedtime.  5  . levothyroxine (SYNTHROID, LEVOTHROID) 50 MCG tablet Take 1 tablet by mouth daily.    Marland Kitchen. LORazepam (ATIVAN) 1 MG tablet Take 1 tablet by mouth every 2 (two) hours as needed.  0  . NOVOLOG FLEXPEN 100 UNIT/ML FlexPen Inject 7 Units into the skin 3 (three) times daily.    . pantoprazole (PROTONIX) 40 MG tablet Take 40 mg by mouth daily.    . polyethylene glycol powder (GLYCOLAX/MIRALAX) powder Take 17 g by mouth daily.    . QUEtiapine (SEROQUEL) 100 MG tablet Take 100 mg by mouth every 6 (six) hours.  0  . QUEtiapine (SEROQUEL) 25 MG tablet Take 25 mg by mouth at bedtime.  3  . timolol (TIMOPTIC) 0.5 % ophthalmic solution Place 1 drop into both eyes daily.    . traZODone (DESYREL) 50 MG tablet Take 1 tablet by mouth at bedtime.       Discharge Medications: Please see discharge summary for a list of discharge medications.  Relevant Imaging Results:  Relevant Lab Results:   Additional Information SSN: [id number]  Jonathon JordanLynn B Jerelle Virden, LCSWA

## 2016-04-22 NOTE — Progress Notes (Addendum)
CSW reviewed bed offers on the Hub. Pt has been declined by [facility] (family's preference) and by [facility]. CSW will continue to review Hub for additional offers. If pt does not receive a bed offer CSW will discuss an alternate d/c plan with pt's family. However, given the time of day it is unlikely that pt will be able to transfer to a facility today. CSW will continue to follow pt and assist as needed.  Update: CSW met with pt and pt's family and explained above. Pt's family states that they are not comfortable caring for pt at home at this time. CSW consulted with EDP and pt will be kept overnight. In the morning, CSW will review the Hub and call Medicaid office about special assistance for pt.  Angela Saunders, MSW, [place]

## 2016-04-22 NOTE — ED Notes (Signed)
Daughter refusing I&O catheter for patient.  EDP notified.

## 2016-04-22 NOTE — Clinical Social Work Note (Signed)
Clinical Social Work Assessment  Patient Details  Name: Angela NegusShirley A Muratore MRN: [id number] Date of Birth: 06-13-1951  Date of referral:  04/22/16               Reason for consult:  Facility Placement                Permission sought to share information with:  Family Supports, Oceanographeracility Contact Representative Permission granted to share information::  Yes, Verbal Permission Granted  Name::        Agency::     Relationship::     Contact Information:     Housing/Transportation Living arrangements for the past 2 months:  Single Family Home Source of Information:  Adult Children Patient Interpreter Needed:  None Criminal Activity/Legal Involvement Pertinent to Current Situation/Hospitalization:  No - Comment as needed Significant Relationships:  Adult Children, Other Family Members Lives with:  Adult Children Do you feel safe going back to the place where you live?  Yes Need for family participation in patient care:  Yes (Comment)  Care giving concerns: Pt is currently unable to care for herself independently.    Social Worker assessment / plan: CSW received consult for possible SNF placement. CSW attempted to engage with pt at pt's bedside. CSW introduced herself and her role as a Child psychotherapistsocial worker. At the time of the assessment pt was slightly agitated and sleepy. Pt also has fluctuating levels of orientation due to her dementia diagnosis. For these reasons CSW completed assessment with pt's daughter Geri SeminoleRosalyn via telephone. Pt's daughter states that she has been caring for pt at home however, pt has become increasingly confused and is dependent with mobility. Pt's daughter feels as if pt has gotten to the point that she cannot manage her at home and would like for pt to be in a facility. CSW consulted with PT and the recommendation for pt is SNF. Pt has Medicare and will not meet the qualifying stay criteria. However, it may be possible for pt to get a long term bed at a SNF under her medicaid.  Pt's daughter is agreeable to this plan. The only challenge with a long term medicaid bed is pt would need to be accepted by a facility that is willing to help pt transfer her current medicaid to special assistance medicaid.   Pt is also currently being [facility]. CSW consulted with Hospice liaison and was informed that the hospice social worker will be able to work with pt and pt's family on placement as a back-up plan in the event that this CSW is unable to place pt from the ED. CSW has completed FL2 and sent referrals for the pt via the Hub. CSW will present bed offers to the family when appropriate. CSW will continue to follow pt and assist as needed.  Employment status:  Retired Health and safety inspectornsurance information:  Armed forces operational officerMedicare, [place] Recommendations:  Skilled Nursing Facility Information / Referral to community resources:  Skilled Nursing Facility  Patient/Family's Response to care: Pt will d/c to SNF long term Medicaid bed. If Medicaid bed is not available pt will return home and will be followed by her hospice social worker to help secure placement.  Patient/Family's Understanding of and Emotional Response to Diagnosis, Current Treatment, and Prognosis: Pt and pt's family are appreciative of the care provided by CSW at this time.  Emotional Assessment Appearance:  Appears stated age Attitude/Demeanor/Rapport:  Lethargic Affect (typically observed):  Unable to Assess Orientation:  Oriented to Self Alcohol /  Substance use:  Not Applicable Psych involvement (Current and /or in the community):  Outpatient Provider  Discharge Needs  Concerns to be addressed:  Care Coordination, Discharge Planning Concerns Readmission within the last 30 days:  No Current discharge risk:  Cognitively Impaired, Dependent with Mobility Barriers to Discharge:  No SNF bed, Inadequate or no insurance   Jonathon JordanLynn B Namir Neto, LCSWA 04/22/2016, 2:48 PM

## 2016-04-22 NOTE — ED Notes (Signed)
Gave pt milk to drink and food tray.

## 2016-04-23 DIAGNOSIS — M25552 Pain in left hip: Secondary | ICD-10-CM | POA: Diagnosis not present

## 2016-04-23 LAB — GLUCOSE, CAPILLARY
GLUCOSE-CAPILLARY: 197 mg/dL — AB (ref 65–99)
GLUCOSE-CAPILLARY: 130 mg/dL — AB (ref 65–99)
Glucose-Capillary: 169 mg/dL — ABNORMAL HIGH (ref 65–99)

## 2016-04-23 MED ORDER — CEFTRIAXONE SODIUM-DEXTROSE 1-3.74 GM-% IV SOLR
1.0000 g | Freq: Once | INTRAVENOUS | Status: DC
  Filled 2016-04-23: qty 50

## 2016-04-23 MED ORDER — DEXTROSE 5 % IV SOLN: 1.0000 g | Freq: Once | INTRAVENOUS | Status: DC

## 2016-04-23 MED ORDER — INSULIN DETEMIR 100 UNIT/ML ~~LOC~~ SOLN
30.0000 [IU] | Freq: Once | SUBCUTANEOUS | Status: AC
  Administered 2016-04-23: 30 [IU] via SUBCUTANEOUS

## 2016-04-23 MED ORDER — CEPHALEXIN 500 MG PO CAPS
500.0000 mg | ORAL_CAPSULE | Freq: Three times a day (TID) | ORAL | Status: DC
  Administered 2016-04-23: 500 mg via ORAL
  Filled 2016-04-23: qty 1

## 2016-04-23 MED ORDER — CEPHALEXIN 500 MG PO CAPS: 500.0000 mg | ORAL_CAPSULE | Freq: Three times a day (TID) | ORAL | Status: DC

## 2016-04-23 NOTE — ED Notes (Signed)
Pt took some of her medications in apple sauce. Spit out 1 atorvastatin. Spoke with daughter regarding the lack of appetite and thirst. Daughter understands that pt may be in last stages of life, and wants her to have the best care possible while she is living.

## 2016-04-23 NOTE — Progress Notes (Signed)
ED visit made. Probation officer, hospice social worker and [place] met with patient's daughter Mackie Pai and discussed at length her concerns regarding caring for her mother at home vs discharging to the hospice home for respite. Mackie Pai had been hopeful that the patient would be able to discharge to a SNF for long term care however no bed offers were available. Plan is for patient to discharge to the hospice home for a 5 night respite stay with continued effort towards placement. Rosalind agreed to a DNR. Hospice medical director and hospice home staff aware of the plan. Hospital care team also aware of discharge plan. Patient will discharge to the hospice home via EMS with signed portable DNR in place.. Patient has remained calm with administration of seroquel 100 mg q 6 hrs. She has not needed any PRN pain medications or antianxiety medications. Thank you. Flo Shanks RN, BSN, [place] [facility] [place], [facility] [phone]

## 2016-04-23 NOTE — ED Notes (Signed)
Pt discharged to hospice home after verbalizing understanding of discharge instructions; nad noted.

## 2016-04-23 NOTE — ED Provider Notes (Signed)
Family had said they were unable to care for patient. Social work was involved. The end result is that the patient will be transferred to hospice for further care. Follow up by doctors at hospice home.   Arnaldo NatalPaul F Rodgers Likes, MD 04/23/16 [phone]

## 2016-04-23 NOTE — ED Notes (Signed)
Pt incontinent of urine; peri-care performed and clean, dry incontinence pads placed under pt.

## 2016-04-23 NOTE — ED Notes (Signed)
Pt's linens and gown changed; sacral bandage changed as well. Pt turned on to right side.

## 2016-04-23 NOTE — ED Notes (Signed)
Hospice and CSW in room with patient and daughter

## 2016-04-23 NOTE — Discharge Instructions (Signed)
To hospice for further care.

## 2016-04-23 NOTE — Progress Notes (Signed)
CSW spent multiple hours with pt, pt family, and hospice discussing pt's discharge plan. Pt's two daughters [place] Meo(Sheletha and NormalRosalyn) have a conflictual relationship. CSW and hospice social worker provided family mediation to come to an agreement about pt's care. Pt does in fact already have long term Medicaid however, there are still no bed offers available on the Hub. Pt's family has agreed that pt will d/[facility]. Pt will receive 5 day respite care and then Marylu LundJanet (hospice social worker) will work on long term placement for the pt.   While in hospice care pt's family has agreed that they will allow hospice to provide pain and agitation medication as needed. Hospice has agreed that they will not administer morphine to the pt per request of pt's daughter Geri SeminoleRosalyn. Pt will be DNR.  RN and EDP have been notified about [facility]. CSW will continue to follow pt and assist with d/c as needed.  Jonathon JordanLynn B Saanvika Vazques, MSW, Theresia MajorsLCSWA [phone]

## 2016-04-23 NOTE — ED Notes (Signed)
Attempted to give pt IV ceftriaxone; no IV in place.

## 2016-04-23 NOTE — ED Notes (Signed)
Hospice at bedside with pt and family 

## 2016-04-23 NOTE — Progress Notes (Signed)
CSW reviewed the Hub and pt has not received any bed offers.  CSW called DSS [phone]). CSW attempted to confirm the status of pt's Medicaid with 2 different Medicaid workers. No answer, CSW left a message. Per voicemail, it could take up to 48 hrs for the Medicaid worker to return phone call.  CSW met with pt and pt's family at pt's bedside. CSW explained the above to pt's daughter, Mike Gip. CSW stated that pt could discharge home with home health and that pt's hospice social worker could continue to look for placement for pt from home. CSW further explained that placement from the ED does not look promising at this time. Sheletha expressed her understanding and agreed that her mother discharging makes sense at this time. However, pt's caregiver Karie Kirks was not agreeable to pt being taken home yesterday because she believes that she is unable to manage caring for her mother at home in her condition.   CSW called pt's daughter Karie Kirks [phone]). CSW explained that placement is not hopeful at this time. CSW suggested a family meeting with pt, pt's daughter, and CSW. [name] agreed and states she will be coming to the hospital within the hour.   CSW called pt's hospice social worker Marcie Bal [phone]). Marcie Bal explained that she has run into several barriers in trying to place pt from home. CSW suggested that Marcie Bal also be present for the family meeting and Marcie Bal agreed.   CSW will meet with pt, pt's family, and hospice social worker by pt's bedside at 1pm today to discuss pt's d/c plan.  Georga Kaufmann, MSW, [place]

## 2016-05-24 DEATH — deceased

## 2016-11-09 IMAGING — CT CT HIP*L* W/O CM
2 of 3 series · 17 of 46 positions shown, 19 images · non-contrast
Comparison: Radiographs 04/22/2016

CLINICAL DATA: Fell 1 week ago while transferring from wheelchair.

EXAM:
CT OF THE LEFT HIP WITHOUT CONTRAST
TECHNIQUE: Multidetector CT imaging of the left hip was performed according to
the standard protocol. Multiplanar CT image reconstructions were
also generated.

[Series 4: axial st · axial · 0.52mm/px · z∈[-1064,-862]mm · 14 of 117 slices shown, 16 images]
[im 8/117  soft-tissue]
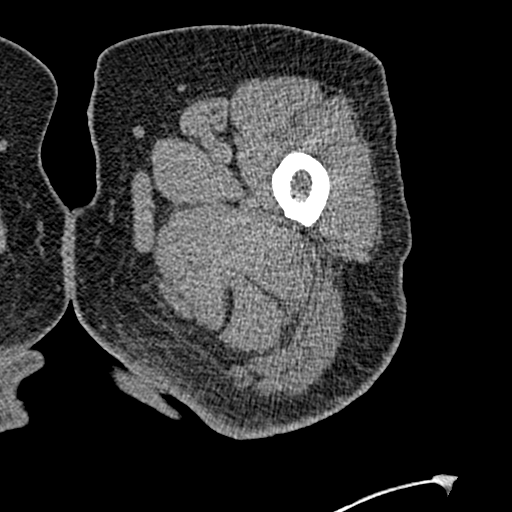
[im 8/117  bone]
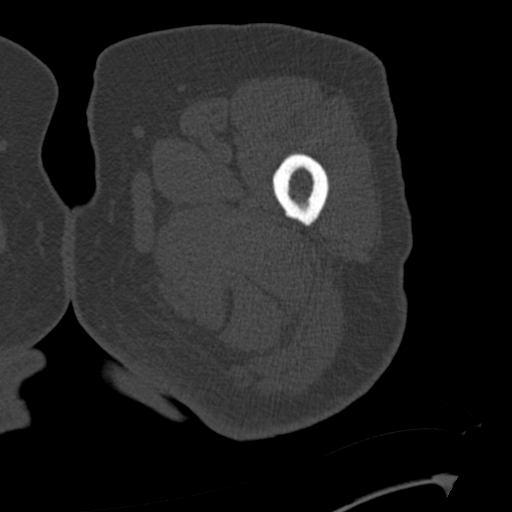
[im 15/117  soft-tissue]
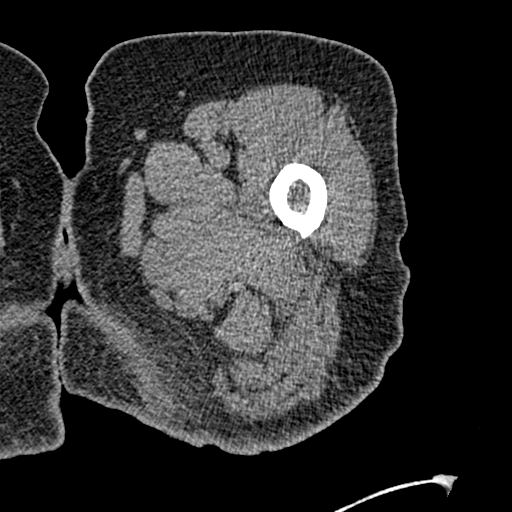
[im 23/117  soft-tissue]
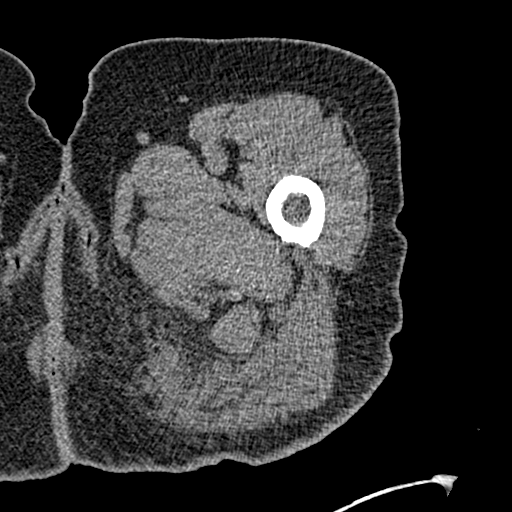
[im 30/117  soft-tissue]
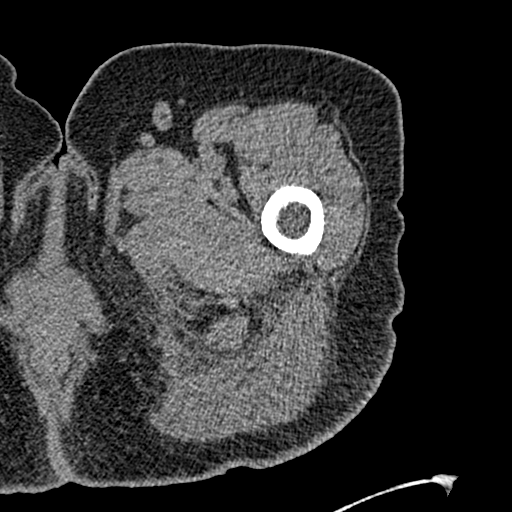
[im 38/117  soft-tissue]
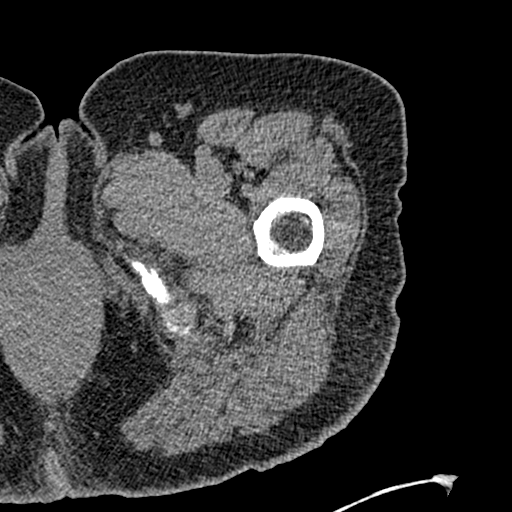
[im 45/117  soft-tissue]
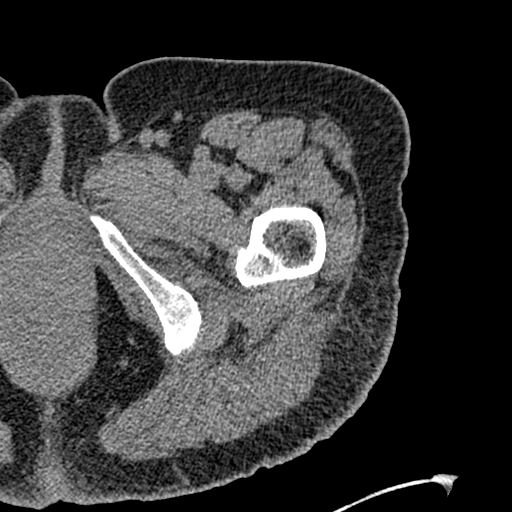
[im 53/117  soft-tissue]
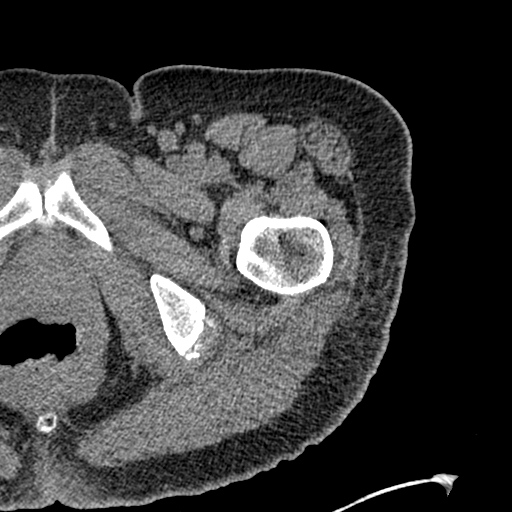
[im 64/117  soft-tissue]
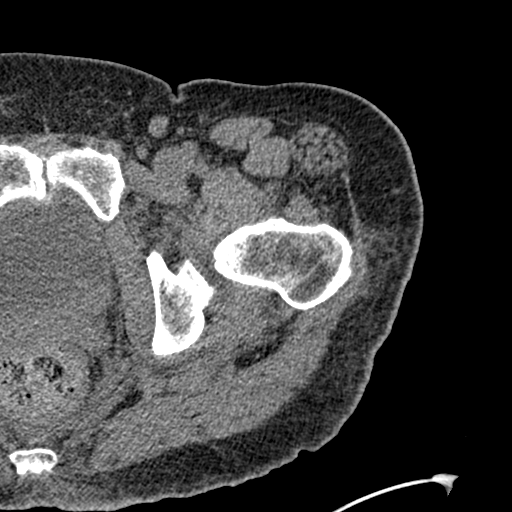
[im 72/117  soft-tissue]
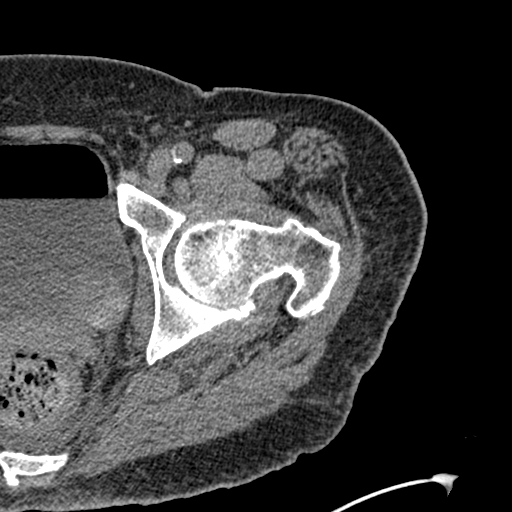
[im 72/117  bone]
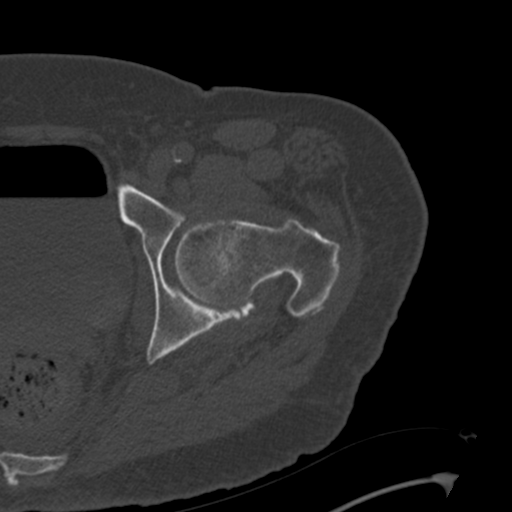
[im 79/117  soft-tissue]
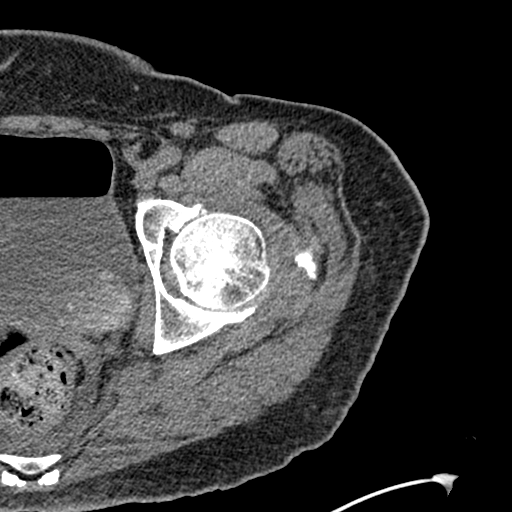
[im 87/117  soft-tissue]
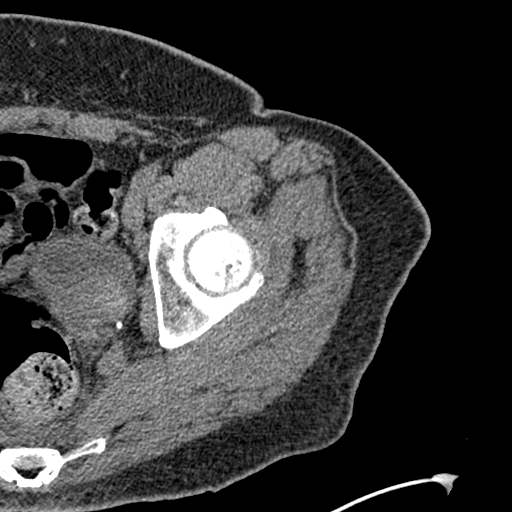
[im 94/117  soft-tissue]
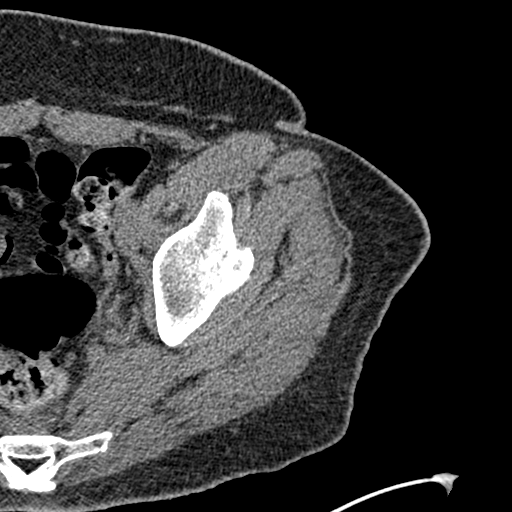
[im 102/117  soft-tissue]
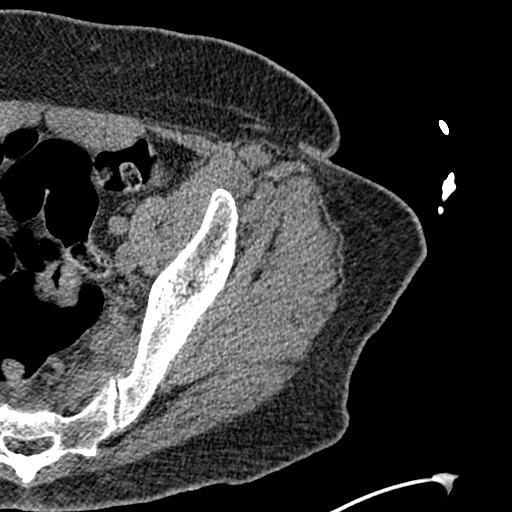
[im 109/117  soft-tissue]
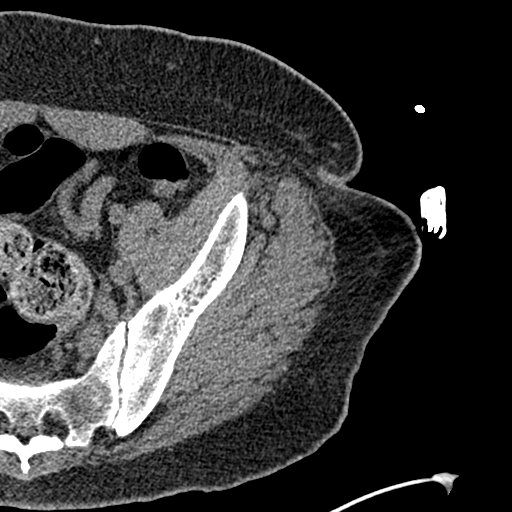

[Series 9: coronal st · coronal · 0.45mm/px · 3 of 115 slices shown]
[im 39/115  soft-tissue]
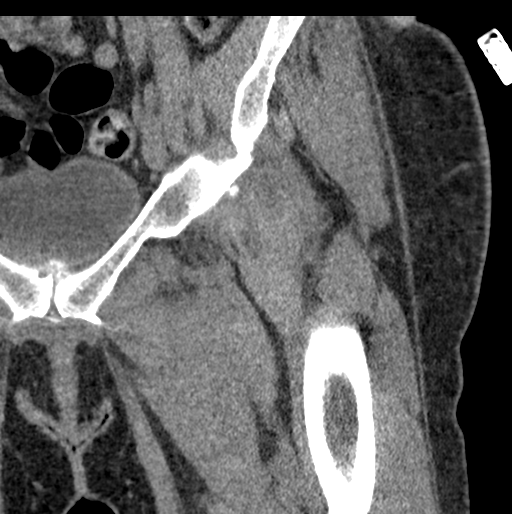
[im 51/115  soft-tissue]
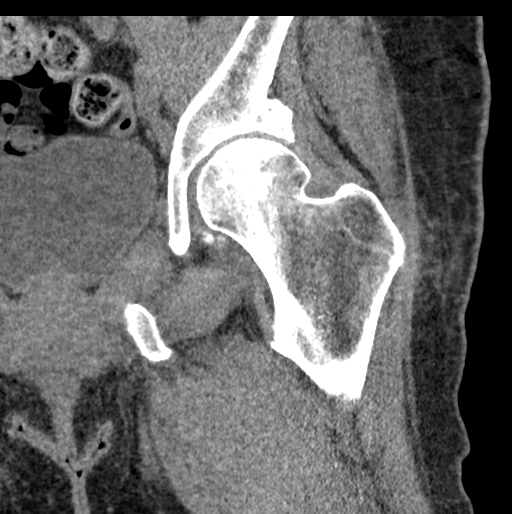
[im 64/115  soft-tissue]
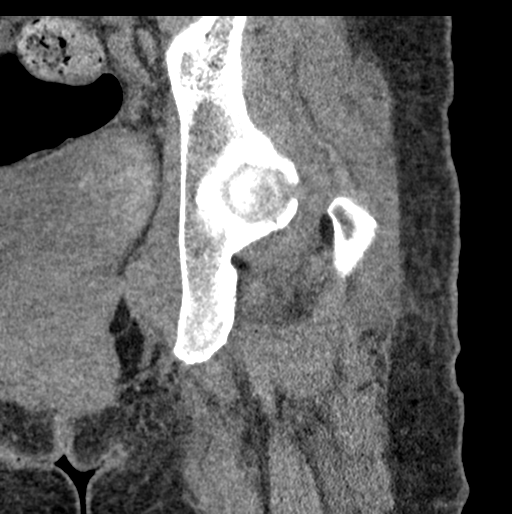

[17 of 46 positions shown; findings below may reference images not displayed]

FINDINGS: Bones/Joint/Cartilage

Negative fracture of the hip or acetabulum. Pubic rami are intact.
Pubic symphysis is intact. No bone lesion or bony destruction.

Ligaments

Suboptimally assessed by CT.

Muscles and Tendons

No acute abnormality.

Soft tissues

Mild soft tissue opacity at the deep aspect of the gluteus maximus
medial margin may represent a small soft tissue hemorrhage or edema.
No drainable collection.
IMPRESSION: Negative for acute fracture. Mild soft tissue edema or hemorrhage
without drainable collection, deep to the medial aspect of the left
gluteus maximus.
